# Patient Record
Sex: Male | Born: 2000 | Race: White | Hispanic: No | Marital: Single | Smoking: Never smoker
Health system: Southern US, Community
[De-identification: ages and names within clinical notes are randomized; demographics above are authoritative.]

---
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
## 2019-06-02 IMAGING — CR DG NASAL BONES 3+V
3 series · 3 of 3 positions shown · non-contrast
Comparison: None.

CLINICAL DATA: Nasal pain/injury

EXAM:
NASAL BONES - 3+ VIEW

[nasal waters]
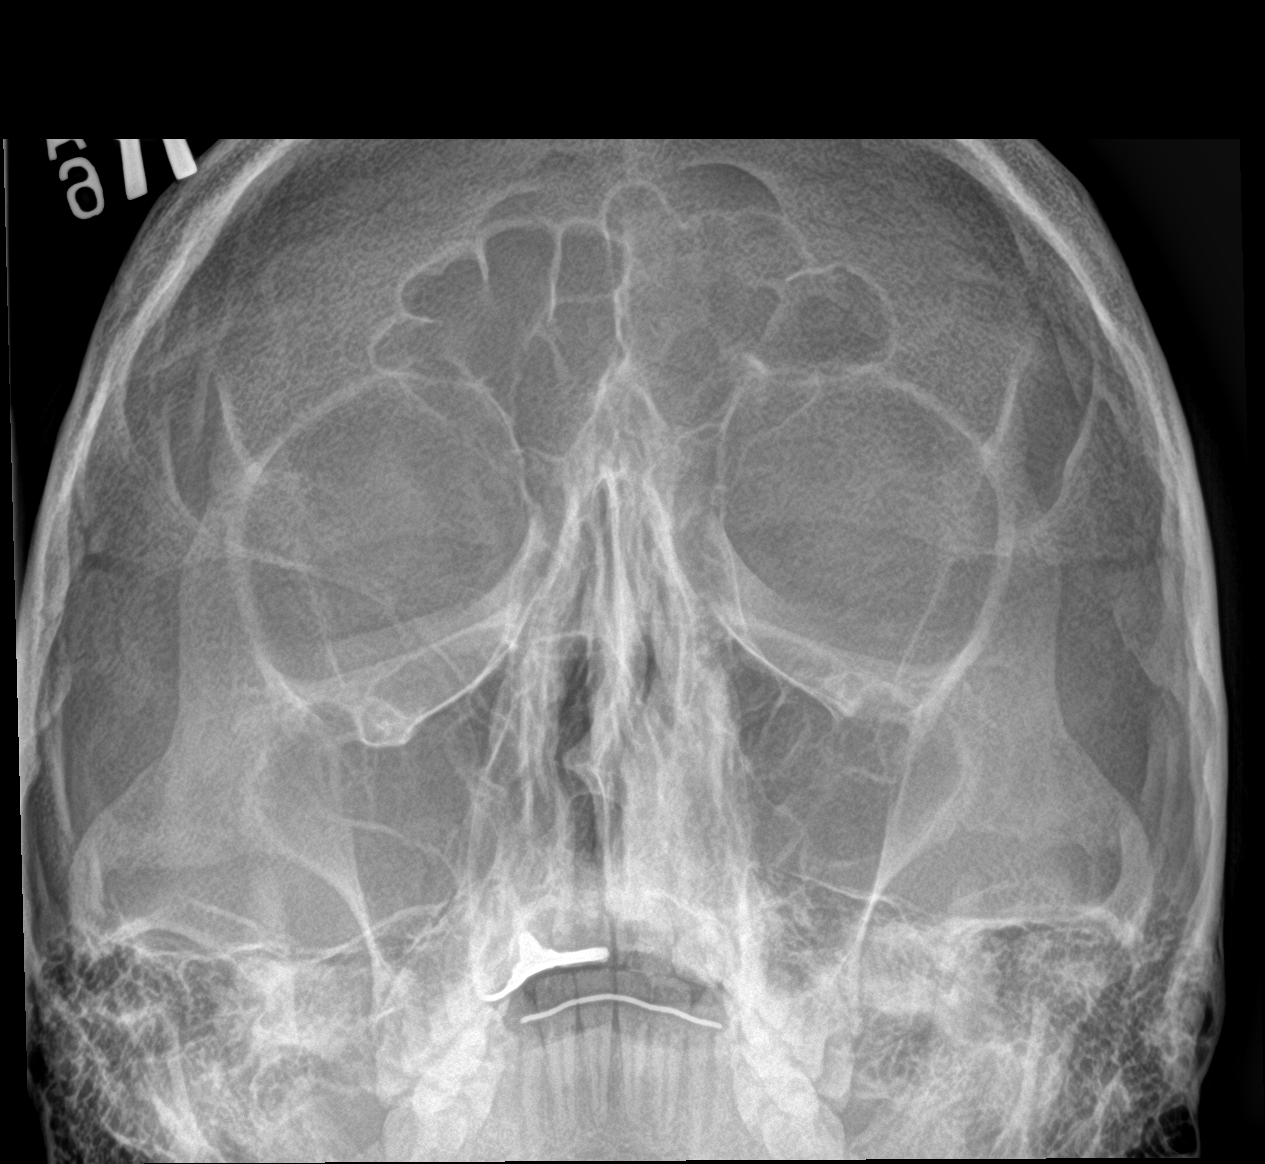

[nasal lat (1 of 2)]
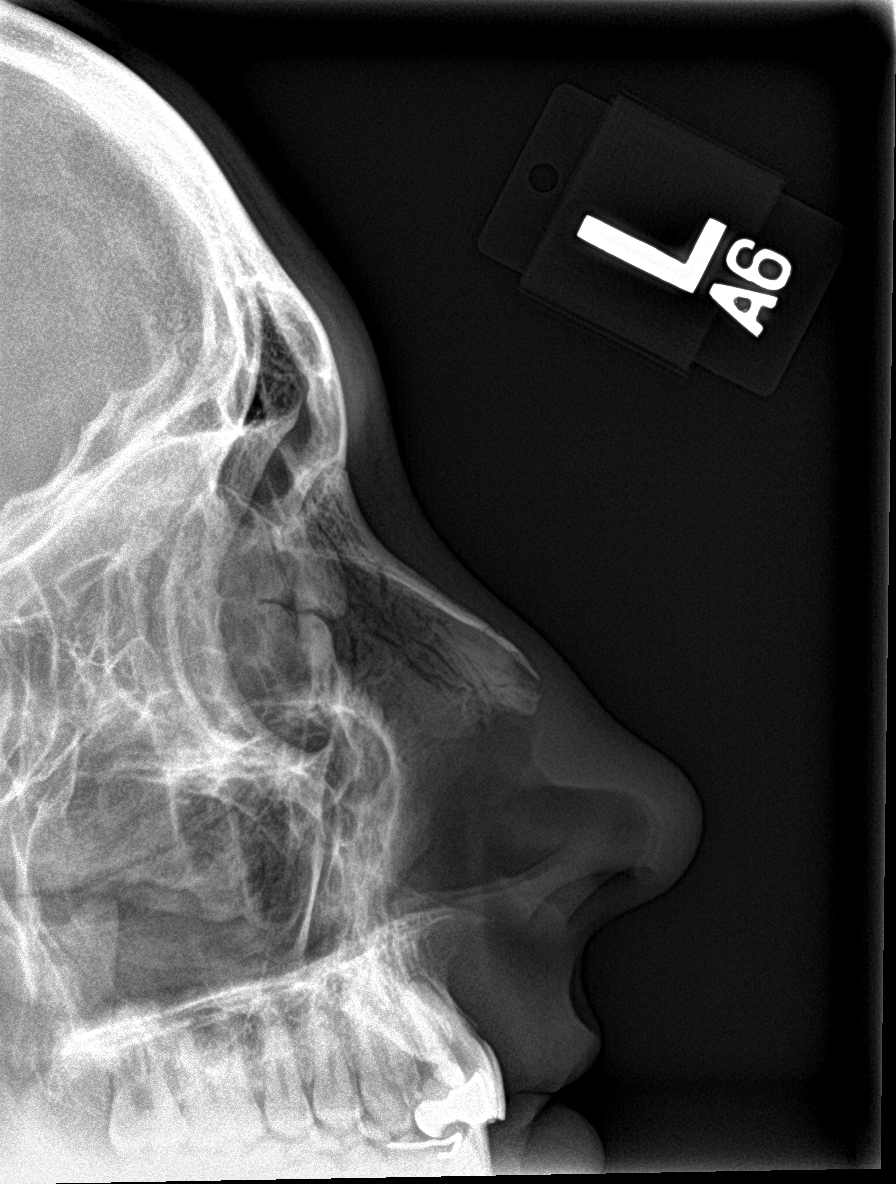

[nasal lat (2 of 2)]
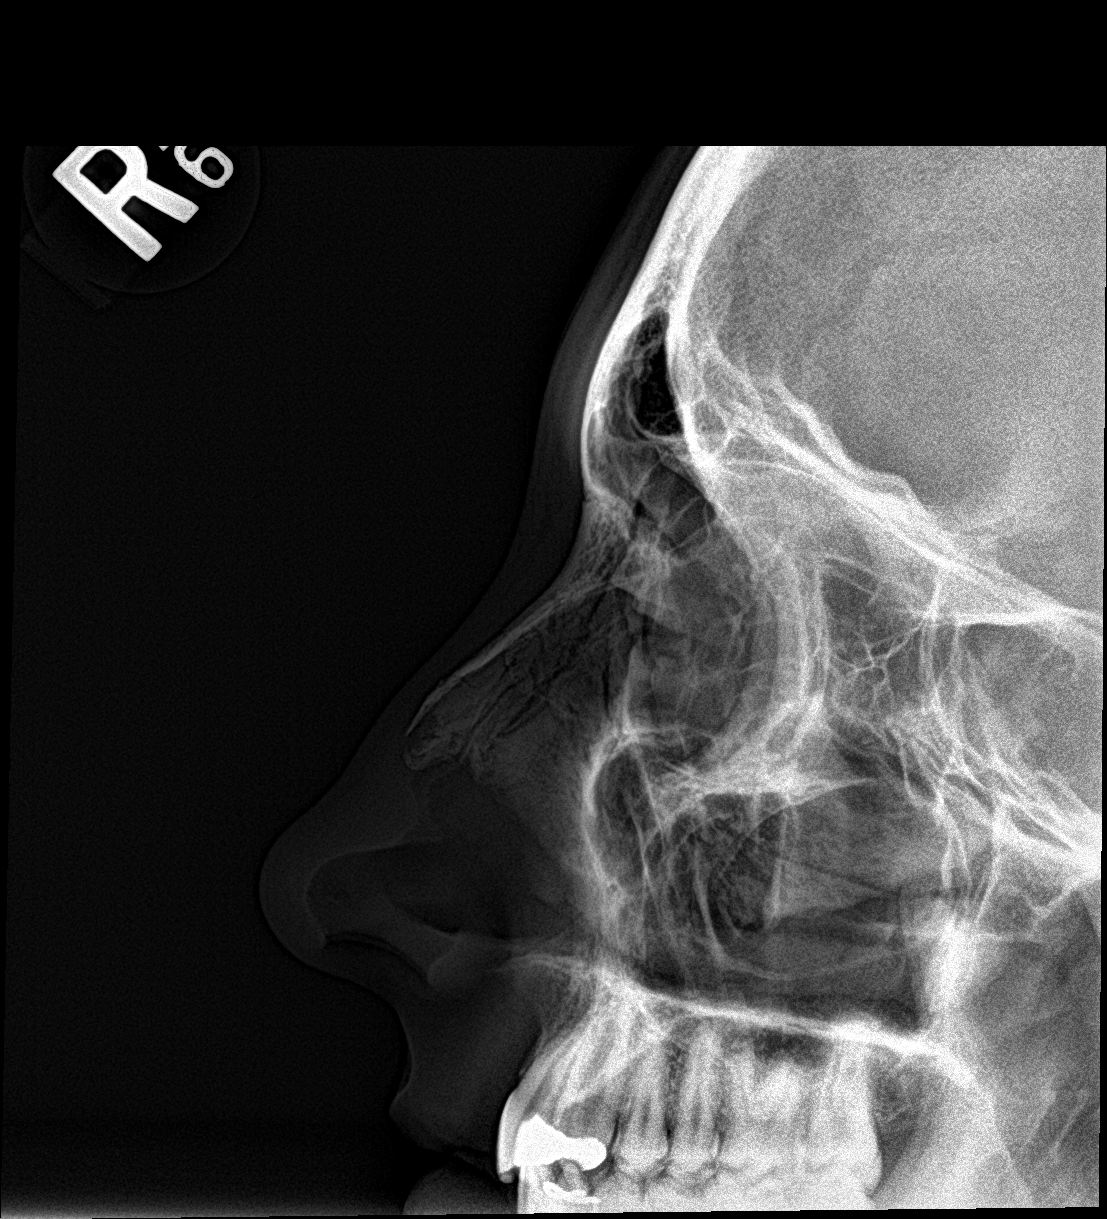

[3 of 3 positions shown; findings below may reference images not displayed]

FINDINGS: No displaced nasal bone fracture is seen.
IMPRESSION: Negative.
